# Patient Record
Sex: Female | Born: 1945 | Race: White | Hispanic: No | Marital: Married | Smoking: Never smoker
Health system: Southern US, Community
[De-identification: ages and names within clinical notes are randomized; demographics above are authoritative.]

## PROBLEM LIST (undated history)

## (undated) DIAGNOSIS — I10 Essential (primary) hypertension: Secondary | ICD-10-CM

## (undated) DIAGNOSIS — E119 Type 2 diabetes mellitus without complications: Secondary | ICD-10-CM

## (undated) DIAGNOSIS — J45909 Unspecified asthma, uncomplicated: Secondary | ICD-10-CM

---

## 2019-04-25 ENCOUNTER — Emergency Department (HOSPITAL_BASED_OUTPATIENT_CLINIC_OR_DEPARTMENT_OTHER)
Admission: EM | Admit: 2019-04-25 | Discharge: 2019-04-25 | Disposition: A | Discharge status: Home / Self Care | Payer: Medicare (Managed Care) | Attending: Emergency Medicine | Admitting: Emergency Medicine

## 2019-04-25 ENCOUNTER — Encounter (HOSPITAL_BASED_OUTPATIENT_CLINIC_OR_DEPARTMENT_OTHER): Payer: Self-pay | Admitting: Emergency Medicine

## 2019-04-25 ENCOUNTER — Other Ambulatory Visit: Payer: Self-pay

## 2019-04-25 ENCOUNTER — Emergency Department (HOSPITAL_BASED_OUTPATIENT_CLINIC_OR_DEPARTMENT_OTHER): Payer: Medicare (Managed Care)

## 2019-04-25 DIAGNOSIS — R05 Cough: Secondary | ICD-10-CM | POA: Diagnosis present

## 2019-04-25 DIAGNOSIS — E119 Type 2 diabetes mellitus without complications: Secondary | ICD-10-CM | POA: Insufficient documentation

## 2019-04-25 DIAGNOSIS — R059 Cough, unspecified: Secondary | ICD-10-CM

## 2019-04-25 DIAGNOSIS — I10 Essential (primary) hypertension: Secondary | ICD-10-CM | POA: Insufficient documentation

## 2019-04-25 DIAGNOSIS — J45909 Unspecified asthma, uncomplicated: Secondary | ICD-10-CM | POA: Insufficient documentation

## 2019-04-25 DIAGNOSIS — U071 COVID-19: Secondary | ICD-10-CM | POA: Diagnosis not present

## 2019-04-25 HISTORY — DX: Essential (primary) hypertension: I10

## 2019-04-25 HISTORY — DX: Type 2 diabetes mellitus without complications: E11.9

## 2019-04-25 HISTORY — DX: Unspecified asthma, uncomplicated: J45.909

## 2019-04-25 LAB — COMPREHENSIVE METABOLIC PANEL
ALT: 27 U/L (ref 0–44)
AST: 22 U/L (ref 15–41)
Albumin: 4 g/dL (ref 3.5–5.0)
Alkaline Phosphatase: 64 U/L (ref 38–126)
Anion gap: 9 (ref 5–15)
BUN: 13 mg/dL (ref 8–23)
CO2: 28 mmol/L (ref 22–32)
Calcium: 9.1 mg/dL (ref 8.9–10.3)
Chloride: 100 mmol/L (ref 98–111)
Creatinine, Ser: 0.7 mg/dL (ref 0.44–1.00)
GFR calc Af Amer: >60 mL/min (ref >60)
GFR calc non Af Amer: >60 mL/min (ref >60)
Glucose, Bld: 243 mg/dL — ABNORMAL HIGH (ref 70–99)
Potassium: 4.2 mmol/L (ref 3.5–5.1)
Sodium: 137 mmol/L (ref 135–145)
Total Bilirubin: 0.6 mg/dL (ref 0.3–1.2)
Total Protein: 7.2 g/dL (ref 6.5–8.1)

## 2019-04-25 LAB — CBC WITH DIFFERENTIAL/PLATELET
Abs Immature Granulocytes: 0.01 10*3/uL (ref 0.00–0.07)
Basophils Absolute: 0 10*3/uL (ref 0.0–0.1)
Basophils Relative: 0 %
Eosinophils Absolute: 0 10*3/uL (ref 0.0–0.5)
Eosinophils Relative: 1 %
HCT: 40 % (ref 36.0–46.0)
Hemoglobin: 13.1 g/dL (ref 12.0–15.0)
Immature Granulocytes: 0 %
Lymphocytes Relative: 26 %
Lymphs Abs: 0.8 10*3/uL (ref 0.7–4.0)
MCH: 28 pg (ref 26.0–34.0)
MCHC: 32.8 g/dL (ref 30.0–36.0)
MCV: 85.5 fL (ref 80.0–100.0)
Monocytes Absolute: 0.3 10*3/uL (ref 0.1–1.0)
Monocytes Relative: 10 %
Neutro Abs: 2.1 10*3/uL (ref 1.7–7.7)
Neutrophils Relative %: 63 %
Platelets: 193 10*3/uL (ref 150–400)
RBC: 4.68 MIL/uL (ref 3.87–5.11)
RDW: 14 % (ref 11.5–15.5)
WBC: 3.3 10*3/uL — ABNORMAL LOW (ref 4.0–10.5)
nRBC: 0 % (ref 0.0–0.2)

## 2019-04-25 MED ORDER — GUAIFENESIN-CODEINE 100-10 MG/5ML PO SOLN: 5.0000 mL | Freq: Every evening | ORAL | 0 refills | Status: AC | PRN

## 2019-04-25 MED ORDER — DOXYCYCLINE HYCLATE 100 MG PO CAPS: 100.0000 mg | ORAL_CAPSULE | Freq: Two times a day (BID) | ORAL | 0 refills | Status: AC

## 2019-04-25 MED ORDER — AEROCHAMBER PLUS FLO-VU MEDIUM MISC
1.0000 | Freq: Once | Status: AC
  Administered 2019-04-25: 1
  Filled 2019-04-25: qty 1

## 2019-04-25 MED ORDER — ALBUTEROL SULFATE HFA 108 (90 BASE) MCG/ACT IN AERS
2.0000 | INHALATION_SPRAY | Freq: Once | RESPIRATORY_TRACT | Status: AC
  Administered 2019-04-25: 2 via RESPIRATORY_TRACT
  Filled 2019-04-25: qty 6.7

## 2019-04-25 MED ORDER — BENZONATATE 100 MG PO CAPS
100.0000 mg | ORAL_CAPSULE | Freq: Once | ORAL | Status: AC
  Administered 2019-04-25: 100 mg via ORAL
  Filled 2019-04-25: qty 1

## 2019-04-25 MED ORDER — BENZONATATE 100 MG PO CAPS: 200.0000 mg | ORAL_CAPSULE | Freq: Three times a day (TID) | ORAL | 0 refills | Status: AC

## 2019-04-25 MED ORDER — ACETAMINOPHEN 500 MG PO TABS
1000.0000 mg | ORAL_TABLET | Freq: Once | ORAL | Status: AC
  Administered 2019-04-25: 1000 mg via ORAL
  Filled 2019-04-25: qty 2

## 2019-04-25 NOTE — ED Triage Notes (Signed)
SOB x 3 days. Productive cough with white sputum.

## 2019-04-25 NOTE — Discharge Instructions (Signed)
You were seen in the ER today for cough and trouble breathing. Your work-up was overall reassuring. Your blood sugar was elevated- please be sure to monitor your sugar and follow up with primary care for this.   We are sending you home with the following:  - Doxycycline- an antibiotic to treat possible bacterial pneumonia- please take this with food as it can cause stomach upset.  - Tessalon- to take 200 mg every 8 hours as needed for coughing - Guaifenesin with codeine- this has a narcotic medicine in it, please only take this at bedtime to help you with coughing while sleeping. Do not drive or operate heavy machinery when taking this medicine. -Albuterol inhaler- use 1-2 puffs every 4-6 hours as needed for trouble breathing/wheezing.   We have prescribed you new medication(s) today. Discuss the medications prescribed today with your pharmacist as they can have adverse effects and interactions with your other medicines including over the counter and prescribed medications. Seek medical evaluation if you start to experience new or abnormal symptoms after taking one of these medicines, seek care immediately if you start to experience difficulty breathing, feeling of your throat closing, facial swelling, or rash as these could be indications of a more serious allergic reaction  Please take tylenol per over the counter doing to help with discomfort.   We have tested you for COVID 19, we will call you within the next 72 hours if results are positive, you may also view these results on MyChart.  We are instructing patient's with COVID 19 or symptoms of COVID 19 to quarantine themselves for 14 days. You may be able to discontinue self quarantine if the following conditions are met:   Persons with COVID-19 who have symptoms and were directed to care for themselves at home may discontinue home isolation under the  following conditions: - It has been at least 7 days have passed since symptoms first  appeared. - AND at least 3 days (72 hours) have passed since recovery defined as resolution of fever without the use of fever-reducing medications and improvement in respiratory symptoms (e.g., cough, shortness of breath)  Please follow the below quarantine instructions.   Please follow up with primary care within 3-5 days for re-evaluation- call prior to going to the office to make them aware of your symptoms as some offices are altering their method of seeing patients with COVID 19 symptoms. Return to the ER for new or worsening symptoms including but not limited to increased work of breathing, chest pain, passing out, or any other concerns.       Person Under Monitoring Name: Janet Lane  Location: [address] [place] [address]   Infection Prevention Recommendations for Individuals Confirmed to have, or Being Evaluated for, 2019 Novel Coronavirus (COVID-19) Infection Who Receive Care at Home  Individuals who are confirmed to have, or are being evaluated for, COVID-19 should follow the prevention steps below until a healthcare provider or local or state health department says they can return to normal activities.  Stay home except to get medical care You should restrict activities outside your home, except for getting medical care. Do not go to work, school, or public areas, and do not use public transportation or taxis.  Call ahead before visiting your doctor Before your medical appointment, call the healthcare provider and tell them that you have, or are being evaluated for, COVID-19 infection. This will help the healthcare provider's office take steps to keep other people from getting infected. Ask  your healthcare provider to call the local or state health department.  Monitor your symptoms Seek prompt medical attention if your illness is worsening (e.g., difficulty breathing). Before going to your medical appointment, call the healthcare provider and tell them that you  have, or are being evaluated for, COVID-19 infection. Ask your healthcare provider to call the local or state health department.  Wear a facemask You should wear a facemask that covers your nose and mouth when you are in the same room with other people and when you visit a healthcare provider. People who live with or visit you should also wear a facemask while they are in the same room with you.  Separate yourself from other people in your home As much as possible, you should stay in a different room from other people in your home. Also, you should use a separate bathroom, if available.  Avoid sharing household items You should not share dishes, drinking glasses, cups, eating utensils, towels, bedding, or other items with other people in your home. After using these items, you should wash them thoroughly with soap and water.  Cover your coughs and sneezes Cover your mouth and nose with a tissue when you cough or sneeze, or you can cough or sneeze into your sleeve. Throw used tissues in a lined trash can, and immediately wash your hands with soap and water for at least 20 seconds or use an alcohol-based hand rub.  Wash your [facility] your hands often and thoroughly with soap and water for at least 20 seconds. You can use an alcohol-based hand sanitizer if soap and water are not available and if your hands are not visibly dirty. Avoid touching your eyes, nose, and mouth with unwashed hands.   Prevention Steps for Caregivers and Household Members of Individuals Confirmed to have, or Being Evaluated for, COVID-19 Infection Being Cared for in the Home  If you live with, or provide care at home for, a person confirmed to have, or being evaluated for, COVID-19 infection please follow these guidelines to prevent infection:  Follow healthcare provider's instructions Make sure that you understand and can help the patient follow any healthcare provider instructions for all care.  Provide  for the patient's basic needs You should help the patient with basic needs in the home and provide support for getting groceries, prescriptions, and other personal needs.  Monitor the patient's symptoms If they are getting sicker, call his or her medical provider and tell them that the patient has, or is being evaluated for, COVID-19 infection. This will help the healthcare provider's office take steps to keep other people from getting infected. Ask the healthcare provider to call the local or state health department.  Limit the number of people who have contact with the patient If possible, have only one caregiver for the patient. Other household members should stay in another home or place of residence. If this is not possible, they should stay in another room, or be separated from the patient as much as possible. Use a separate bathroom, if available. Restrict visitors who do not have an essential need to be in the home.  Keep older adults, very young children, and other sick people away from the patient Keep older adults, very young children, and those who have compromised immune systems or chronic health conditions away from the patient. This includes people with chronic heart, lung, or kidney conditions, diabetes, and cancer.  Ensure good ventilation Make sure that shared spaces in the home have good  air flow, such as from an air conditioner or an opened window, weather permitting.  Wash your hands often Wash your hands often and thoroughly with soap and water for at least 20 seconds. You can use an alcohol based hand sanitizer if soap and water are not available and if your hands are not visibly dirty. Avoid touching your eyes, nose, and mouth with unwashed hands. Use disposable paper towels to dry your hands. If not available, use dedicated cloth towels and replace them when they become wet.  Wear a facemask and gloves Wear a disposable facemask at all times in the room and gloves  when you touch or have contact with the patient's blood, body fluids, and/or secretions or excretions, such as sweat, saliva, sputum, nasal mucus, vomit, urine, or feces.  Ensure the mask fits over your nose and mouth tightly, and do not touch it during use. Throw out disposable facemasks and gloves after using them. Do not reuse. Wash your hands immediately after removing your facemask and gloves. If your personal clothing becomes contaminated, carefully remove clothing and launder. Wash your hands after handling contaminated clothing. Place all used disposable facemasks, gloves, and other waste in a lined container before disposing them with other household waste. Remove gloves and wash your hands immediately after handling these items.  Do not share dishes, glasses, or other household items with the patient Avoid sharing household items. You should not share dishes, drinking glasses, cups, eating utensils, towels, bedding, or other items with a patient who is confirmed to have, or being evaluated for, COVID-19 infection. After the person uses these items, you should wash them thoroughly with soap and water.  Wash laundry thoroughly Immediately remove and wash clothes or bedding that have blood, body fluids, and/or secretions or excretions, such as sweat, saliva, sputum, nasal mucus, vomit, urine, or feces, on them. Wear gloves when handling laundry from the patient. Read and follow directions on labels of laundry or clothing items and detergent. In general, wash and dry with the warmest temperatures recommended on the label.  Clean all areas the individual has used often Clean all touchable surfaces, such as counters, tabletops, doorknobs, bathroom fixtures, toilets, phones, keyboards, tablets, and bedside tables, every day. Also, clean any surfaces that may have blood, body fluids, and/or secretions or excretions on them. Wear gloves when cleaning surfaces the patient has come in contact  with. Use a diluted bleach solution (e.g., dilute bleach with 1 part bleach and 10 parts water) or a household disinfectant with a label that says EPA-registered for coronaviruses. To make a bleach solution at home, add 1 tablespoon of bleach to 1 quart (4 cups) of water. For a larger supply, add  cup of bleach to 1 gallon (16 cups) of water. Read labels of cleaning products and follow recommendations provided on product labels. Labels contain instructions for safe and effective use of the cleaning product including precautions you should take when applying the product, such as wearing gloves or eye protection and making sure you have good ventilation during use of the product. Remove gloves and wash hands immediately after cleaning.  Monitor yourself for signs and symptoms of illness Caregivers and household members are considered close contacts, should monitor their health, and will be asked to limit movement outside of the home to the extent possible. Follow the monitoring steps for close contacts listed on the symptom monitoring form.   ? If you have additional questions, contact your local health department or call the epidemiologist on  call at [phone] (available 24/7). ? This guidance is subject to change. For the most up-to-date guidance from [facility], please refer to their website: [email or web address]

## 2019-04-25 NOTE — ED Provider Notes (Signed)
[facility] EMERGENCY DEPARTMENT Provider Note   CSN: [id number] Arrival date & time: 04/25/19  2297     History Chief Complaint  Patient presents with  . Cough    Janet Lane is a 74 y.o. female with a history of hypertension, hyperlipidemia, DM, & GERD who presents to the ED with complaints of acute on chronic cough over the past 5 days. Patient states she always has a degree of a cough, but over the past several days this has become much harsher an more frequent with intermittent white sputum production. With onset of worsened cough she had subjective fever, chills, and felt very poorly- these sxs have overall resolved with cough persisting as well as dyspnea on exertion as well as occasional lightheadedness when she is coughing a lot of becomes very dyspneic. No syncope has occurred. She has pain to the bilateral lateral ribs with coughing, otherwise no pain. Using inhaler at home with some relief. Denies chest pain, leg pain/swelling, hemoptysis, recent surgery/trauma,  hormone use, personal hx of cancer, or hx of DVT/PE. She was in the car for 12 hours 12/27 but they stopped frequently to get out and walk around. [facility]. No known contact with anyone with covid 19.      HPI     Past Medical History:  Diagnosis Date  . Asthma   . Diabetes mellitus without complication (HCC)   . Hypertension     There are no problems to display for this patient.   History reviewed. No pertinent surgical history.   OB History   No obstetric history on file.     No family history on file.  Social History   Tobacco Use  . Smoking status: Never Smoker  . Smokeless tobacco: Never Used  Substance Use Topics  . Alcohol use: Not Currently  . Drug use: Not on file    Home Medications Prior to Admission medications   Not on File    Allergies    Patient has no known allergies.  Review of Systems   Review of Systems  Constitutional:  Positive for chills (resolved) and fever (resolved).  HENT: Positive for congestion (baseline, unchanged). Negative for ear pain and sore throat.   Respiratory: Positive for cough and shortness of breath.        Positive for rib pain with coughing.   Cardiovascular: Negative for chest pain and leg swelling.  Gastrointestinal: Negative for abdominal pain, blood in stool, constipation, diarrhea, nausea and vomiting.  Neurological: Positive for light-headedness. Negative for syncope.  All other systems reviewed and are negative.   Physical Exam Updated Vital Signs BP (!) 154/65 (BP Location: Right Arm)   Pulse 81   Temp 100.1 F (37.8 C) (Oral)   Resp 20   Ht 5\' 7"  (1.702 m)   Wt 87.1 kg   SpO2 95%   BMI 30.07 kg/m   Physical Exam Vitals and nursing note reviewed.  Constitutional:      General: She is not in acute distress.    Appearance: She is well-developed. She is not toxic-appearing.  HENT:     Head: Normocephalic and atraumatic.  Eyes:     General:        Right eye: No discharge.        Left eye: No discharge.     Conjunctiva/sclera: Conjunctivae normal.  Cardiovascular:     Rate and Rhythm: Normal rate and regular rhythm.  Pulmonary:     Effort: Pulmonary  effort is normal. No respiratory distress.     Breath sounds: Normal breath sounds. No wheezing, rhonchi or rales.     Comments: SpO2  >/= 95% on RA @ rest & with ambulation.  Chest:     Chest wall: Tenderness (bilateral lateral inferior ribs without overlying skin changes or palpable crepitus) present.  Abdominal:     General: There is no distension.     Palpations: Abdomen is soft.     Tenderness: There is no abdominal tenderness. There is no guarding or rebound.  Musculoskeletal:     Cervical back: Neck supple.     Right lower leg: No edema.     Left lower leg: No edema.  Skin:    General: Skin is warm and dry.     Findings: No rash.  Neurological:     Mental Status: She is alert.     Comments: Clear  speech.   Psychiatric:        Behavior: Behavior normal.     ED Results / Procedures / Treatments   Labs (all labs ordered are listed, but only abnormal results are displayed) Labs Reviewed  COMPREHENSIVE METABOLIC PANEL  CBC WITH DIFFERENTIAL/PLATELET    EKG EKG Interpretation  Date/Time:  Saturday April 25 2019 10:11:14 EST Ventricular Rate:  76 PR Interval:    QRS Duration: 86 QT Interval:  386 QTC Calculation: 434 R Axis:   29 Text Interpretation: Sinus rhythm Borderline prolonged PR interval Low voltage, precordial leads Borderline T abnormalities, anterior leads Confirmed by Virgel Manifold [phone]) on 04/25/2019 10:35:24 AM   Radiology DG Chest Portable 1 View  Result Date: 04/25/2019 CLINICAL DATA:  C/o productive cough x 4 days w/SOB and fever. Patient states negative COVID test recently but no test found in care everywhere EXAM: PORTABLE CHEST 1 VIEW COMPARISON:  None. FINDINGS: The cardiomediastinal contours are within normal limits. Aortic arch calcification. There are a few small opacities at the left lung base favored to represent atelectasis or scarring. The right lung is clear. No pneumothorax or large pleural effusion. No acute finding in the visualized skeleton. IMPRESSION: There are a few small opacities at the left lung base favored to represent atelectasis or scarring. No acute findings in the chest. Electronically Signed   By: Audie Pinto M.D.   On: 04/25/2019 10:29    Procedures Procedures (including critical care time)  Medications Ordered in ED Medications  acetaminophen (TYLENOL) tablet 1,000 mg (has no administration in time range)  albuterol (VENTOLIN HFA) 108 (90 Base) MCG/ACT inhaler 2 puff (has no administration in time range)  AeroChamber Plus Flo-Vu Medium MISC 1 each (has no administration in time range)  benzonatate (TESSALON) capsule 100 mg (has no administration in time range)    ED Course  I have reviewed the triage vital signs and  the nursing notes.  Pertinent labs & imaging results that were available during my care of the patient were reviewed by me and considered in my medical decision making (see chart for details). Janet Lane was evaluated in Emergency Department on 04/25/2019 for the symptoms described in the history of present illness. He/she was evaluated in the context of the global COVID-19 pandemic, which necessitated consideration that the patient might be at risk for infection with the SARS-CoV-2 virus that causes COVID-19. Institutional protocols and algorithms that pertain to the evaluation of patients at risk for COVID-19 are in a state of rapid change based on information released by regulatory bodies including the CDC and federal and  state organizations. These policies and algorithms were followed during the patient's care in the ED.    MDM Rules/Calculators/A&P                     Patient presents to the ED with complaints of cough & intermittent dyspnea. Nontoxic appearing, resting comfortably, vitals WNL with the exception of elevated BP- doubt HTN emergency. Exam w/ lungs CTA. No hypoxia @ rest or with ambulation. No peripheral edema. Reproducible rib discomfort with palpation. Plan for labs, CXR, EKG, symptomatic care CBC: Leukopenia. No anemia.  CMP: Hyperglycemia without acidosis/anion gap elevation. No significant electrolyte derangement.  DXI:PJASN are a few small opacities at the left lung base favored to represent atelectasis or scarring. No acute findings in the chest EKG: Sinus rhythm Borderline prolonged PR interval Low voltage, precordial leads Borderline T abnormalities, anterior leads--> No STEMI  No significant anemia or electrolyte derangement on labs.  Low risk wells, not tachycardic or hypoxic- doubt PE.  No chest pain, no STEMI on EKG, doubt ACS.  CXR w/o finds of fluid overload.  Suspect covid vs. CAP at this time. Will send covid test, cover with doxycycline in the time being.  Patient's biggest complaint is coughing especially at night- will provide antitussives. Send home with inhaler. Remains without signs of respiratory distress, maintaining SpO2 with ambulation, does not appear to require admission. Appropriate for discharge @ this time. I discussed results, treatment plan, need for follow-up, and return precautions with the patient. Provided opportunity for questions, patient confirmed understanding and is in agreement with plan.   Findings and plan of care discussed with supervising physician Dr. Juleen China who is in agreement.   Final Clinical Impression(s) / ED Diagnoses Final diagnoses:  Cough    Rx / DC Orders ED Discharge Orders         Ordered    doxycycline (VIBRAMYCIN) 100 MG capsule  2 times daily     04/25/19 1131    benzonatate (TESSALON) 100 MG capsule  Every 8 hours     04/25/19 1131    guaiFENesin-codeine 100-10 MG/5ML syrup  At bedtime PRN     04/25/19 1131           Janet Lane, Welch R, PA-C 04/25/19 1136    Raeford Razor, MD 04/25/19 1229

## 2019-04-26 LAB — SARS CORONAVIRUS 2 (TAT 6-24 HRS): SARS Coronavirus 2: POSITIVE — AB

## 2019-04-27 ENCOUNTER — Telehealth: Payer: Self-pay | Admitting: Nurse Practitioner

## 2019-04-27 NOTE — Telephone Encounter (Signed)
Called to Discuss with patient about Covid symptoms and the use of bamlanivimab, a monoclonal antibody infusion for those with mild to moderate Covid symptoms and at a high risk of hospitalization.     Pt is qualified for this infusion at the [facility] center due to co-morbid conditions and/or a member of an at-risk group.     Unable to reach pt  

## 2020-12-17 IMAGING — DX DG CHEST 1V PORT
1 series · 1 of 1 positions shown · non-contrast
Comparison: None.

CLINICAL DATA: C/o productive cough x 4 days w/SOB and fever.
Patient states negative COVID test recently but no test found in
[REDACTED]

EXAM:
PORTABLE CHEST 1 VIEW

[chest ap]
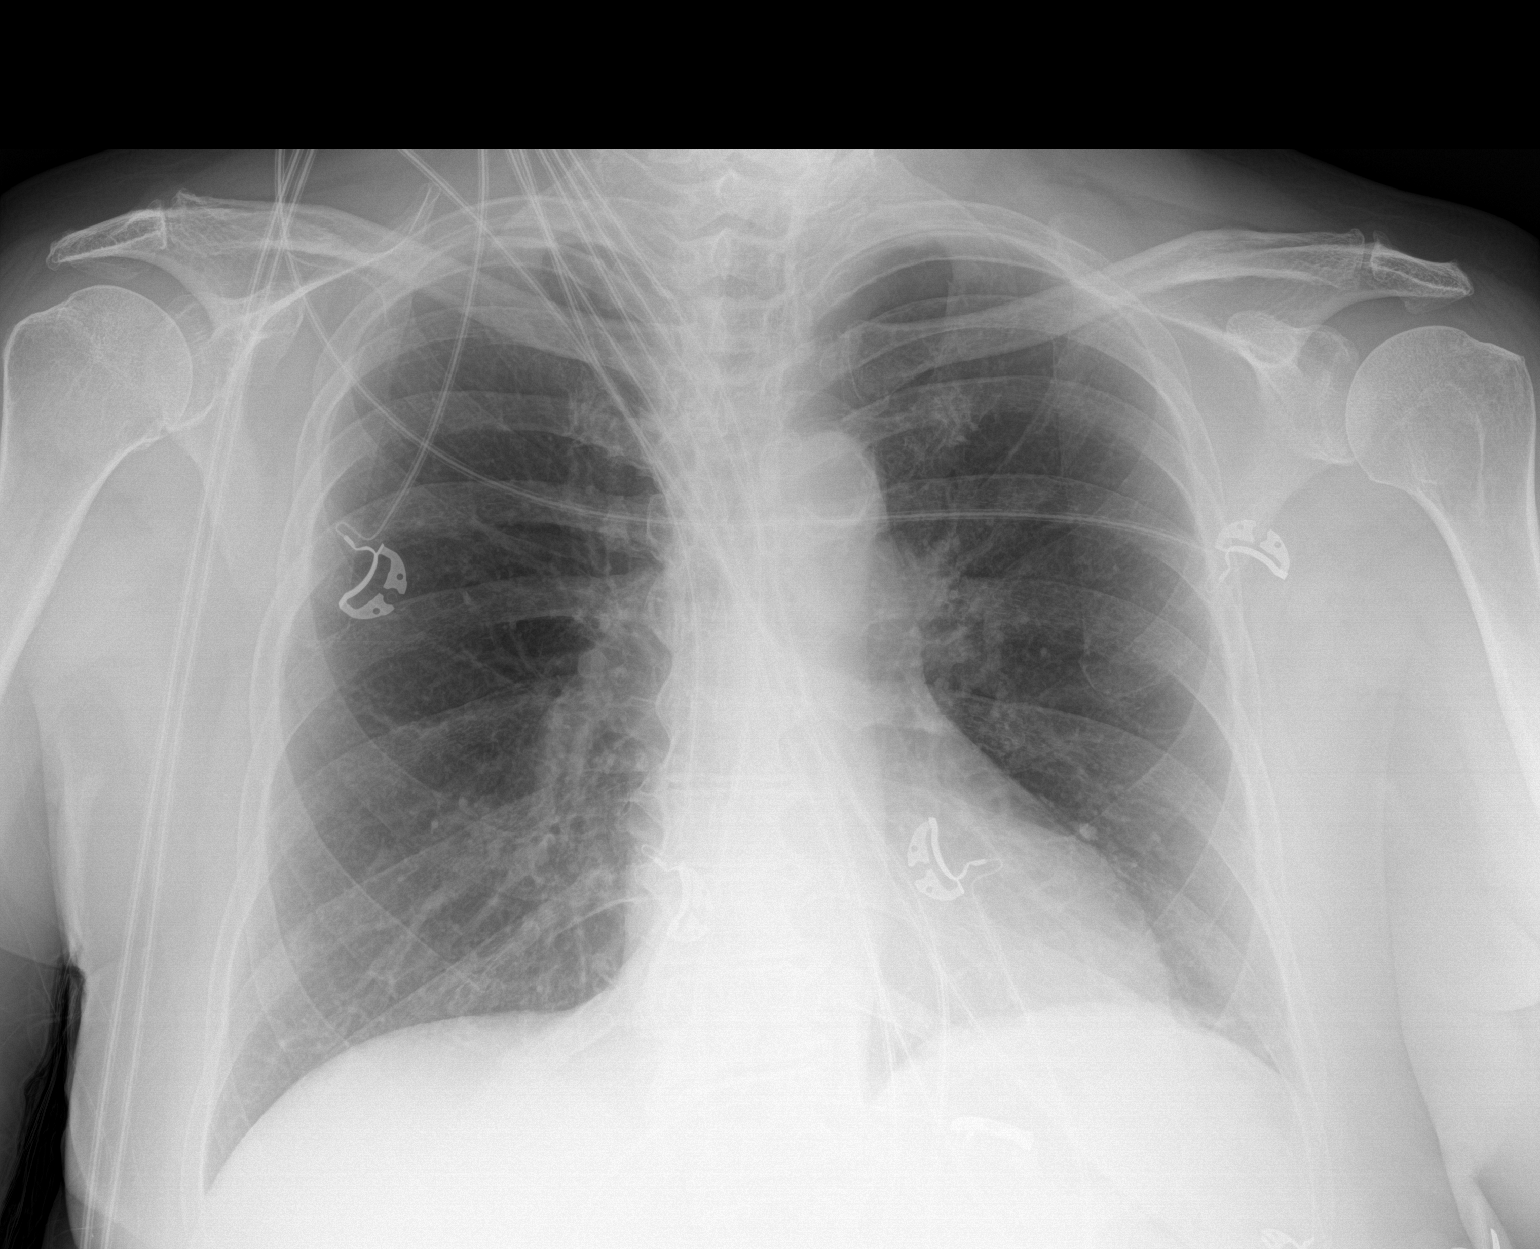

[1 of 1 positions shown; findings below may reference images not displayed]

FINDINGS: The cardiomediastinal contours are within normal limits. Aortic arch
calcification. There are a few small opacities at the left lung base
favored to represent atelectasis or scarring. The right lung is
clear. No pneumothorax or large pleural effusion. No acute finding
in the visualized skeleton.
IMPRESSION: There are a few small opacities at the left lung base favored to
represent atelectasis or scarring. No acute findings in the chest.
